# Patient Record
Sex: Male | Born: 1995 | Race: Black or African American | Hispanic: No | Marital: Single | Smoking: Never smoker
Health system: Southern US, Community
[De-identification: ages and names within clinical notes are randomized; demographics above are authoritative.]

---

## 2014-05-09 ENCOUNTER — Encounter (HOSPITAL_COMMUNITY): Payer: Self-pay | Admitting: Emergency Medicine

## 2014-05-09 ENCOUNTER — Emergency Department (HOSPITAL_COMMUNITY): Payer: PRIVATE HEALTH INSURANCE

## 2014-05-09 ENCOUNTER — Emergency Department (HOSPITAL_COMMUNITY)
Admission: EM | Admit: 2014-05-09 | Discharge: 2014-05-10 | Disposition: A | Discharge status: Home / Self Care | Payer: PRIVATE HEALTH INSURANCE | Attending: Emergency Medicine | Admitting: Emergency Medicine

## 2014-05-09 DIAGNOSIS — Y9367 Activity, basketball: Secondary | ICD-10-CM | POA: Insufficient documentation

## 2014-05-09 DIAGNOSIS — W19XXXA Unspecified fall, initial encounter: Secondary | ICD-10-CM

## 2014-05-09 DIAGNOSIS — Y92310 Basketball court as the place of occurrence of the external cause: Secondary | ICD-10-CM | POA: Diagnosis not present

## 2014-05-09 DIAGNOSIS — Y998 Other external cause status: Secondary | ICD-10-CM | POA: Diagnosis not present

## 2014-05-09 DIAGNOSIS — S3992XA Unspecified injury of lower back, initial encounter: Secondary | ICD-10-CM | POA: Insufficient documentation

## 2014-05-09 DIAGNOSIS — Z8739 Personal history of other diseases of the musculoskeletal system and connective tissue: Secondary | ICD-10-CM | POA: Diagnosis not present

## 2014-05-09 DIAGNOSIS — M545 Low back pain, unspecified: Secondary | ICD-10-CM

## 2014-05-09 DIAGNOSIS — W03XXXA Other fall on same level due to collision with another person, initial encounter: Secondary | ICD-10-CM | POA: Diagnosis not present

## 2014-05-09 NOTE — Discharge Instructions (Signed)
Back Pain, Adult Low back pain is very common. About 1 in 5 people have back pain.The cause of low back pain is rarely dangerous. The pain often gets better over time.About half of people with a sudden onset of back pain feel better in just 2 weeks. About 8 in 10 people feel better by 6 weeks.  CAUSES Some common causes of back pain include:  Strain of the muscles or ligaments supporting the spine.  Wear and tear (degeneration) of the spinal discs.  Arthritis.  Direct injury to the back. DIAGNOSIS Most of the time, the direct cause of low back pain is not known.However, back pain can be treated effectively even when the exact cause of the pain is unknown.Answering your caregiver's questions about your overall health and symptoms is one of the most accurate ways to make sure the cause of your pain is not dangerous. If your caregiver needs more information, he or she may order lab work or imaging tests (X-rays or MRIs).However, even if imaging tests show changes in your back, this usually does not require surgery. HOME CARE INSTRUCTIONS For many people, back pain returns.Since low back pain is rarely dangerous, it is often a condition that people can learn to manageon their own.   Remain active. It is stressful on the back to sit or stand in one place. Do not sit, drive, or stand in one place for more than 30 minutes at a time. Take short walks on level surfaces as soon as pain allows.Try to increase the length of time you walk each day.  Do not stay in bed.Resting more than 1 or 2 days can delay your recovery.  Do not avoid exercise or work.Your body is made to move.It is not dangerous to be active, even though your back may hurt.Your back will likely heal faster if you return to being active before your pain is gone.  Pay attention to your body when you bend and lift. Many people have less discomfortwhen lifting if they bend their knees, keep the load close to their bodies,and  avoid twisting. Often, the most comfortable positions are those that put less stress on your recovering back.  Find a comfortable position to sleep. Use a firm mattress and lie on your side with your knees slightly bent. If you lie on your back, put a pillow under your knees.  Only take over-the-counter or prescription medicines as directed by your caregiver. Over-the-counter medicines to reduce pain and inflammation are often the most helpful.Your caregiver may prescribe muscle relaxant drugs.These medicines help dull your pain so you can more quickly return to your normal activities and healthy exercise.  Put ice on the injured area.  Put ice in a plastic bag.  Place a towel between your skin and the bag.  Leave the ice on for 15-20 minutes, 03-04 times a day for the first 2 to 3 days. After that, ice and heat may be alternated to reduce pain and spasms.  Ask your caregiver about trying back exercises and gentle massage. This may be of some benefit.  Avoid feeling anxious or stressed.Stress increases muscle tension and can worsen back pain.It is important to recognize when you are anxious or stressed and learn ways to manage it.Exercise is a great option. SEEK MEDICAL CARE IF:  You have pain that is not relieved with rest or medicine.  You have pain that does not improve in 1 week.  You have new symptoms.  You are generally not feeling well. SEEK   IMMEDIATE MEDICAL CARE IF:   You have pain that radiates from your back into your legs.  You develop new bowel or bladder control problems.  You have unusual weakness or numbness in your arms or legs.  You develop nausea or vomiting.  You develop abdominal pain.  You feel faint. Document Released: 05/29/2005 Document Revised: 11/28/2011 Document Reviewed: 09/30/2013 [employer] Patient Information 2015 [employer]. This information is not intended to replace advice given to you by your health care provider. Make sure you  discuss any questions you have with your health care provider.  

## 2014-05-09 NOTE — ED Notes (Signed)
[facility]  While playing basketball pt "took a charge" and fell straight back on his back. Denies hitting head, no loc. C/o lower back pain and numbness and tingling to arms and legs but not at this time. Pt has hx of lumbar sacral problems and is getting that worked up with pcp.

## 2014-05-09 NOTE — ED Provider Notes (Signed)
CSN: [id number]     Arrival date & time 05/09/14  1851 History   First MD Initiated Contact with Patient 05/09/14 1853     Chief Complaint  Patient presents with  . 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 He reports that he was hit by another player and fell flat onto his back. To me he denies hitting his head, but EMS reports that he did. There was no loss of consciousness. He was having neck pain and bilateral upper extremity tingling. This has resolved. He currently has pain in his lumbar spine. The pain is worse when he tries to move his legs. He has not taken anything to improve his symptoms. He is currently denying any pain medication. No bowel or bladder incontinence.  The history is provided by the patient. No language interpreter was used.    History reviewed. No pertinent past medical history. History reviewed. No pertinent past surgical history. No family history on file. History  Substance Use Topics  . Smoking status: Never Smoker   . Smokeless tobacco: Not on file  . Alcohol Use: No    Review of Systems  Constitutional: Negative for fever and chills.  Respiratory: Negative for shortness of breath.   Cardiovascular: Negative for chest pain.  Gastrointestinal: Negative for nausea, vomiting and abdominal pain.  Musculoskeletal: Positive for myalgias, back pain and neck pain (resolved).  Neurological: Positive for numbness (resolved).  All other systems reviewed and are negative.     Allergies  Review of patient's allergies indicates no known allergies.  Home Medications   Prior to Admission medications   Not on File   BP 121/63 mmHg  Pulse 63  Temp(Src) 98.7 F (37.1 C) (Oral)  Resp 18  SpO2  100% Physical Exam  Constitutional: He is oriented to person, place, and time. He appears well-developed and well-nourished. No distress.  HENT:  Head: Normocephalic and atraumatic.  Right Ear: External ear normal.  Left Ear: External ear normal.  Nose: Nose normal.  No broken or loose teeth  Eyes: Conjunctivae and EOM are normal. Pupils are equal, round, and reactive to light.  Neck: Normal range of motion. No spinous process tenderness and no muscular tenderness present. No tracheal deviation present.  Cardiovascular: Normal rate, regular rhythm, normal heart sounds, intact distal pulses and normal pulses.   Pulses:      Radial pulses are 2+ on the right side, and 2+ on the left side.       Posterior tibial pulses are 2+ on the right side, and 2+ on the left side.  Pulmonary/Chest: Effort normal and breath sounds normal. No stridor.  Abdominal: Soft. He exhibits no distension. There is no tenderness.  Musculoskeletal: Normal range of motion.  Tender to palpation to lumbar spine. No deformities or step-offs.  Neurological: He is alert and oriented to person, place, and time. He has normal strength. No sensory deficit. Coordination normal. GCS eye subscore is 4. GCS verbal subscore is 5. GCS motor subscore is 6.  Reflex Scores:      Patellar reflexes are 2+ on the right side and 2+ on the left side. Finger nose finger normal. Grip strength 5/5 bilaterally  Skin: Skin is warm and dry. He is not diaphoretic.  Psychiatric: He has a normal mood and affect. His behavior is normal.  Nursing note and vitals reviewed.   ED Course  Procedures (including critical care time) Labs Review Labs Reviewed - No data to display  Imaging Review Dg Lumbar Spine Complete  05/09/2014   CLINICAL DATA:  Lower back pain after being pushed to the ground while playing basketball.  EXAM: LUMBAR SPINE - COMPLETE 4+ VIEW  COMPARISON:  None.  FINDINGS: There is no evidence of lumbar spine fracture. Alignment is  normal. Intervertebral disc spaces are maintained. Posterior facet joints appear normal.  IMPRESSION: Normal lumbar spine.   Electronically Signed   By: Roque LiasJames  Green M.D.   On: 05/09/2014 21:00   Ct Head Wo Contrast  05/09/2014   CLINICAL DATA:  Numbness and tingling to all extremities after being knocked backwards while playing basketball.  EXAM: CT HEAD WITHOUT CONTRAST  CT CERVICAL SPINE WITHOUT CONTRAST  TECHNIQUE: Multidetector CT imaging of the head and cervical spine was performed following the standard protocol without intravenous contrast. Multiplanar CT image reconstructions of the cervical spine were also generated.  COMPARISON:  None.  FINDINGS: CT HEAD FINDINGS  Bony calvarium appears intact. No mass effect or midline shift is noted. Ventricular size is within normal limits. There is no evidence of mass lesion, hemorrhage or acute infarction.  CT CERVICAL SPINE FINDINGS  No fracture or spondylolisthesis is noted. Disc spaces and posterior facet joints appear intact.  IMPRESSION: Normal head CT.  Normal cervical spine.   Electronically Signed   By: Roque LiasJames  Green M.D.   On: 05/09/2014 20:40   Ct Cervical Spine Wo Contrast  05/09/2014   CLINICAL DATA:  Numbness and tingling to all extremities after being knocked backwards while playing basketball.  EXAM: CT HEAD WITHOUT CONTRAST  CT CERVICAL SPINE WITHOUT CONTRAST  TECHNIQUE: Multidetector CT imaging of the head and cervical spine was performed following the standard protocol without intravenous contrast. Multiplanar CT image reconstructions of the cervical spine were also generated.  COMPARISON:  None.  FINDINGS: CT HEAD FINDINGS  Bony calvarium appears intact. No mass effect or midline shift is noted. Ventricular size is within normal limits. There is no evidence of mass lesion, hemorrhage or acute infarction.  CT CERVICAL SPINE FINDINGS  No fracture or spondylolisthesis is noted. Disc spaces and posterior facet joints appear intact.  IMPRESSION:  Normal head CT.  Normal cervical spine.   Electronically Signed   By: Roque LiasJames  Green M.D.   On: 05/09/2014 20:40     EKG Interpretation None      MDM   Final diagnoses:  Fall  Midline low back pain without sciatica   Patient with back pain after fall.  Tingling initially to bilateral upper extremities and hx of herniated disc to cervical spine. CT cervical spine and head CT are negative for acute pathology. These symptoms have resolved. No neurological deficits and normal neuro exam.  Patient can walk but states is painful.  No loss of bowel or bladder control.  No concern for cauda equina.  No fever, night sweats, weight loss, h/o cancer, IVDU.  RICE protocol and pain medicine indicated and discussed with patient. Dr. Littie DeedsGentry evaluated patient and agrees with plan. Dr. Berline Choughigby, sports medicine fellow who was at the basketball game spoke with patient during ED stay. Discussed reasons to return to ED immediately. Vital signs stable for discharge. Patient / Family / Caregiver informed of clinical course, understand medical decision-making process, and agree with plan.    Mora BellmanHannah S Quantarius Genrich, PA-C 05/09/14 2332  Mirian MoMatthew Gentry, MD 05/14/14 [phone]

## 2016-02-15 IMAGING — CT CT HEAD W/O CM
3 of 5 series · 14 of 47 positions shown, 16 images · non-contrast
Comparison: None.

CLINICAL DATA: Numbness and tingling to all extremities after being
knocked backwards while playing basketball.

EXAM:
CT HEAD WITHOUT CONTRAST
CT CERVICAL SPINE WITHOUT CONTRAST
TECHNIQUE: Multidetector CT imaging of the head and cervical spine was
performed following the standard protocol without intravenous
contrast. Multiplanar CT image reconstructions of the cervical spine
were also generated.

[Series 7: coronals · coronal · 0.27mm/px · 3 of 40 slices shown]
[im 14/40  brain]
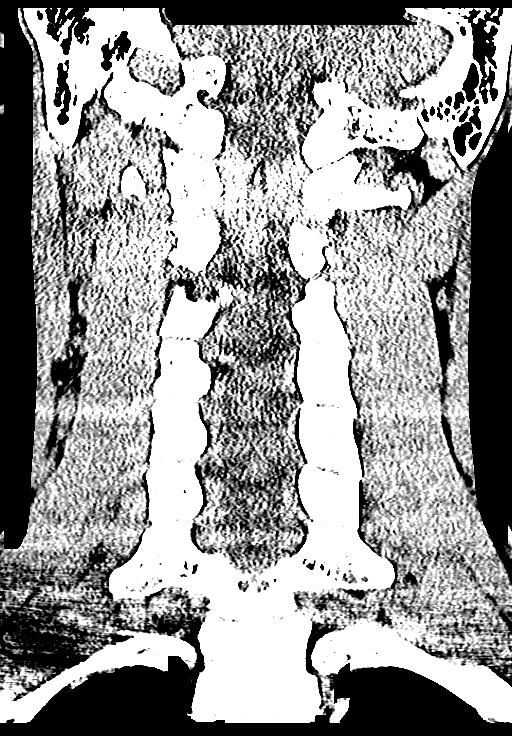
[im 18/40  brain]
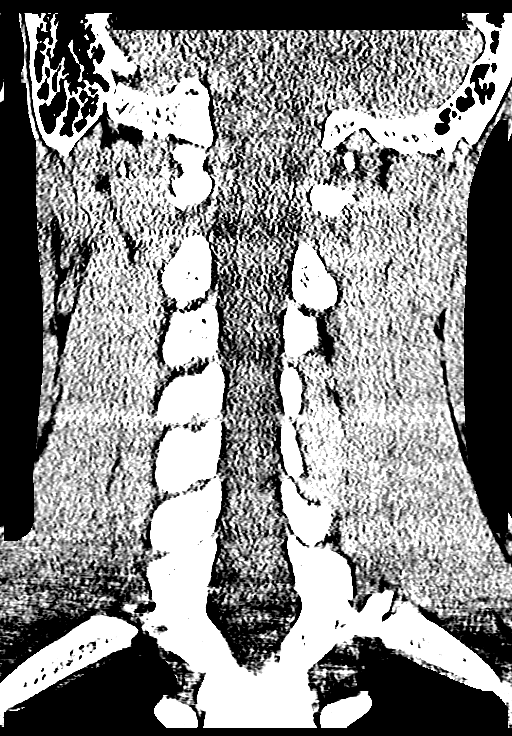
[im 22/40  brain]
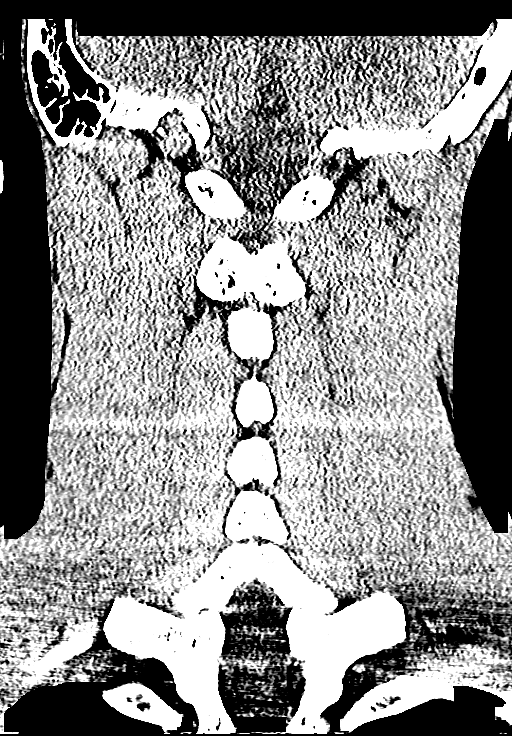

[Series 8: sagittals · sagittal · 0.28mm/px · 3 of 46 slices shown]
[im 16/46  brain]
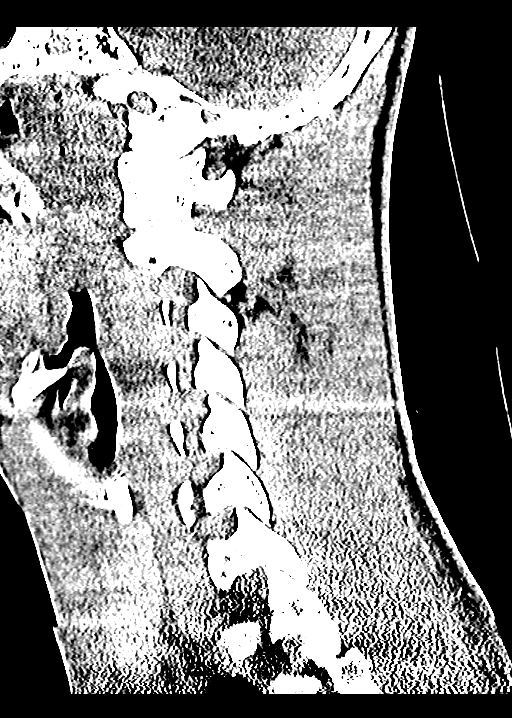
[im 23/46  brain]
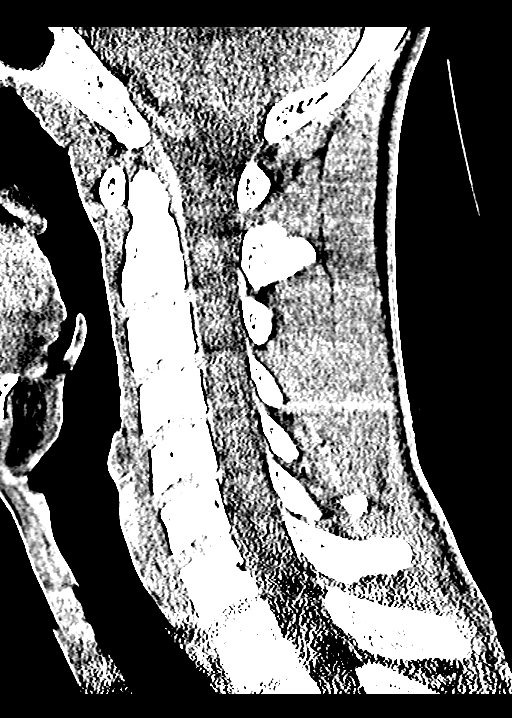
[im 31/46  brain]
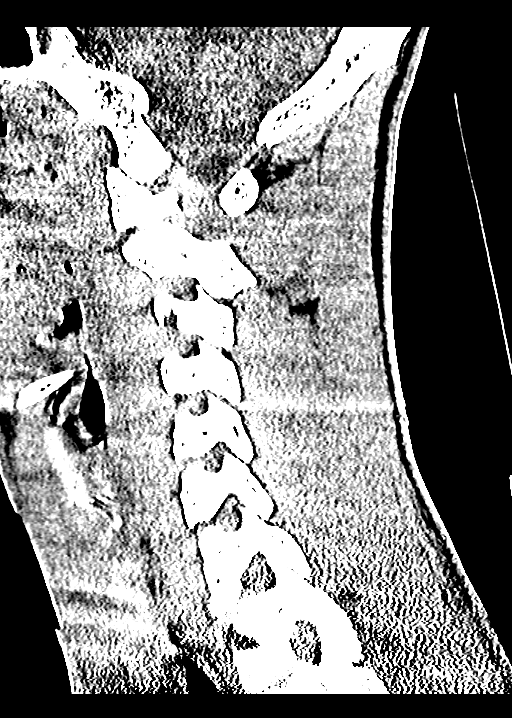

[Series 9: orthogonals · axial · 0.28mm/px · z∈[-333,-173]mm · 8 of 99 slices shown, 10 images]
[im 9/99  brain]
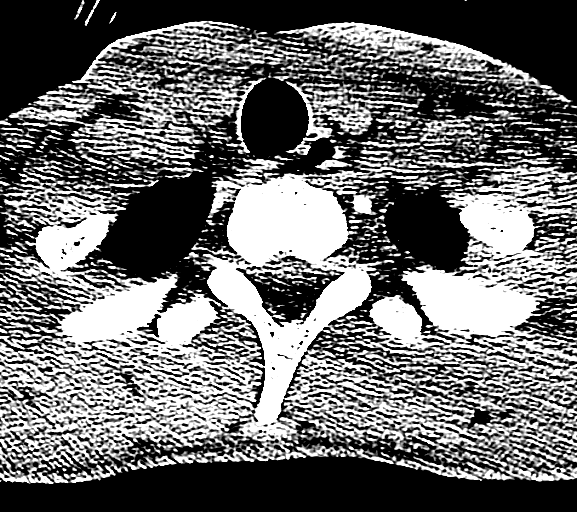
[im 9/99  bone]
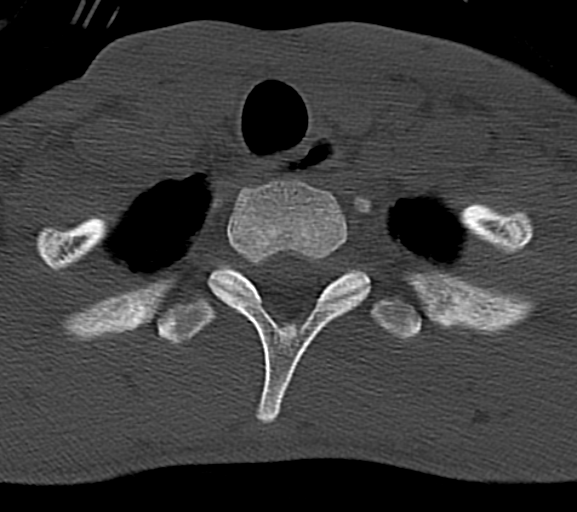
[im 25/99  brain]
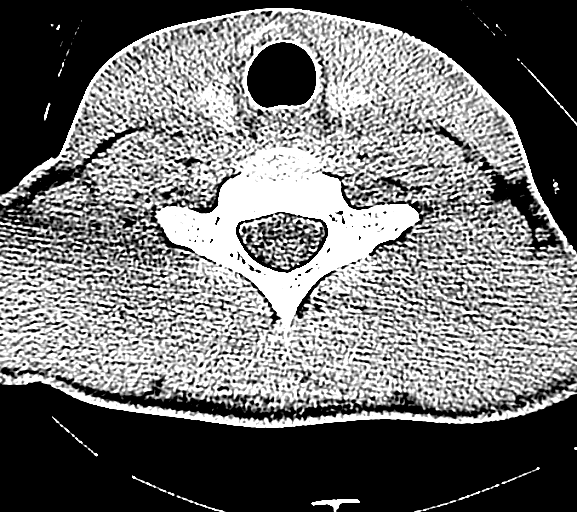
[im 33/99  brain]
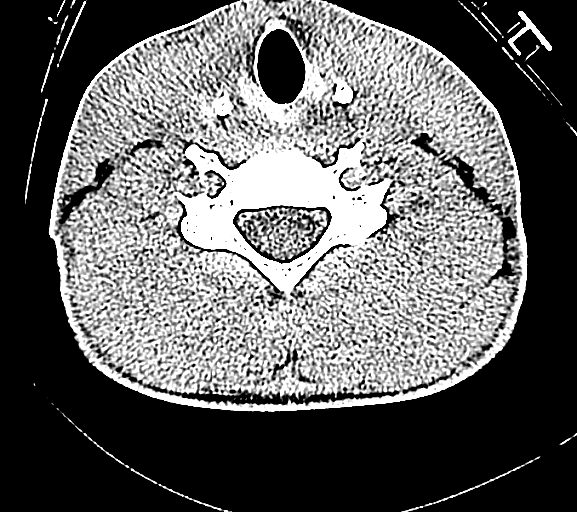
[im 41/99  brain]
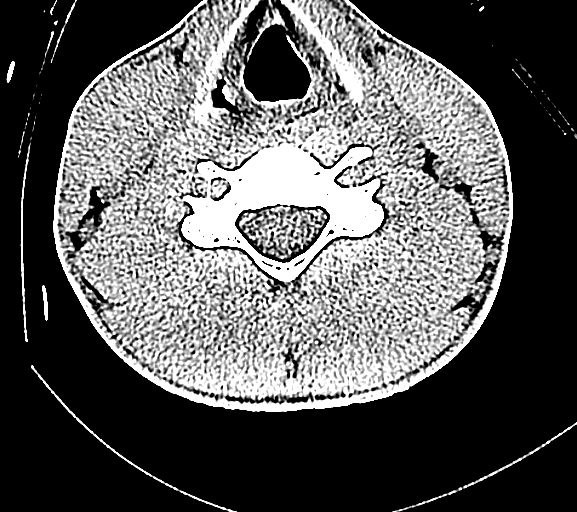
[im 58/99  brain]
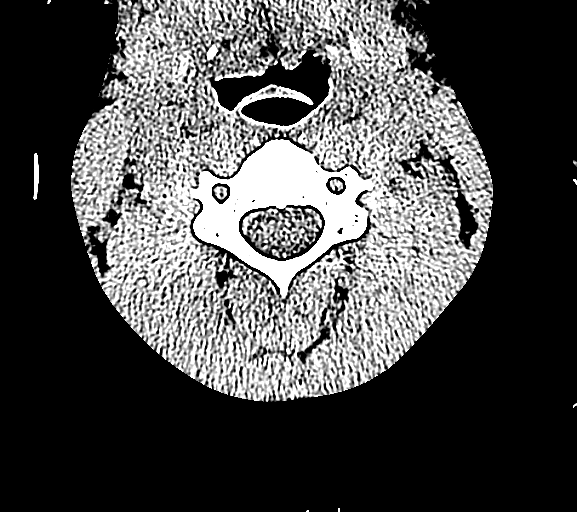
[im 58/99  bone]
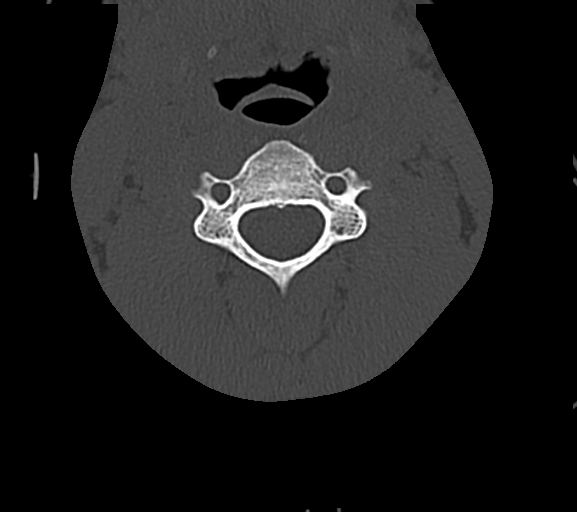
[im 66/99  brain]
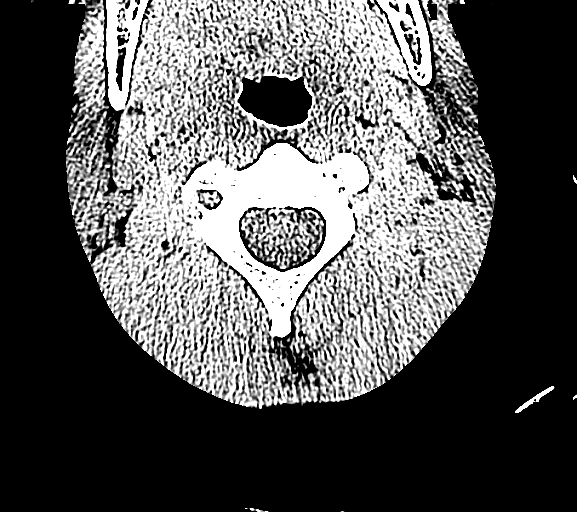
[im 74/99  brain]
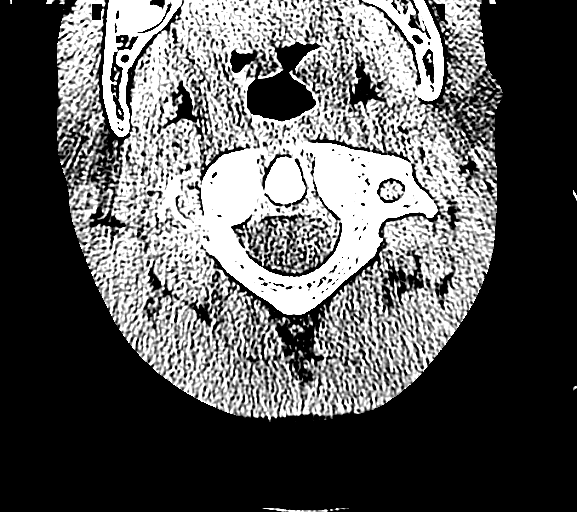
[im 90/99  brain]
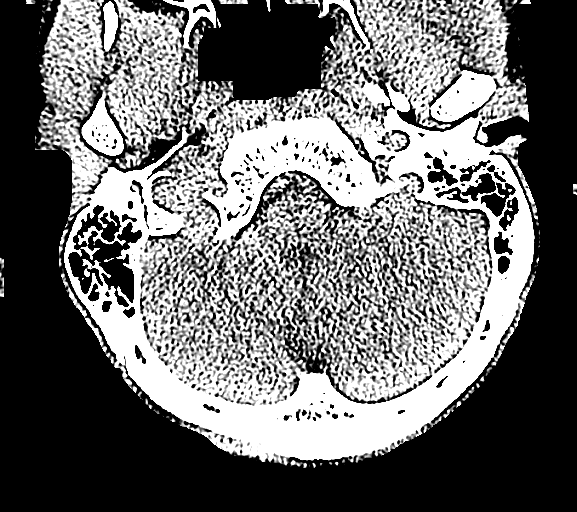

[14 of 47 positions shown; findings below may reference images not displayed]

FINDINGS: CT HEAD FINDINGS

Bony calvarium appears intact. No mass effect or midline shift is
noted. Ventricular size is within normal limits. There is no
evidence of mass lesion, hemorrhage or acute infarction.

CT CERVICAL SPINE FINDINGS

No fracture or spondylolisthesis is noted. Disc spaces and posterior
facet joints appear intact.
IMPRESSION: Normal head CT.

Normal cervical spine.
